# Patient Record
Sex: Female | Born: 1985 | Hispanic: No | Marital: Married | Smoking: Never smoker
Health system: Southern US, Community
[De-identification: ages and names within clinical notes are randomized; demographics above are authoritative.]

---

## 2016-05-11 NOTE — L&D Delivery Note (Signed)
Patient is 31 y.o. G2P1011 [redacted]w[redacted]d admitted for SROM ~0700. S/p Augmentation with Pitocin.   Delivery Note At 7:50 AM a viable female was delivered via Vaginal, Spontaneous Delivery (Presentation: direct OA).  APGAR: 7, 8; weight 7 lb 5.1 oz (3320 g).   Placenta status: Intact.  Cord: 3V with the following complications: None.  Cord pH: N/A  Anesthesia:  Epidural Episiotomy: None Lacerations: 2nd degree Suture Repair: vicryl Est. Blood Loss (mL): 250  Mom to postpartum.  Baby to Couplet care / Skin to Skin.  Upon arrival patient was complete and pushing. She pushed with good maternal effort to deliver a viable infant in cephalic. No nuchal cord present. Baby delivered with some difficulty of the anterior shoulder. Anterior shoulder dystocia reduced with suprapubic pressure. The rest of body delivered with ease. Infant noted to have poor tone and respirations so cord was clamped and cut and baby went to warmer for stimulation.  Placenta delivered spontaneously with gentle cord traction. Fundus firm with massage and Pitocin. Perineum inspected and found to have 2nd degree laceration, which was repaired with good hemostasis achieved. Counts of sharps, instruments, and lap pads were all correct.   Caryl Ada, DO OB Fellow 02/02/2017, 8:41 AM

## 2016-08-13 LAB — OB RESULTS CONSOLE GC/CHLAMYDIA
Chlamydia: NEGATIVE
GC PROBE AMP, GENITAL: NEGATIVE

## 2016-08-13 LAB — OB RESULTS CONSOLE HEPATITIS B SURFACE ANTIGEN: Hepatitis B Surface Ag: NEGATIVE

## 2016-08-13 LAB — OB RESULTS CONSOLE RPR: RPR: NONREACTIVE

## 2016-08-13 LAB — OB RESULTS CONSOLE RUBELLA ANTIBODY, IGM: Rubella: IMMUNE

## 2016-08-13 LAB — OB RESULTS CONSOLE HIV ANTIBODY (ROUTINE TESTING): HIV: NONREACTIVE

## 2016-09-28 ENCOUNTER — Other Ambulatory Visit (HOSPITAL_COMMUNITY): Payer: Self-pay | Admitting: Nurse Practitioner

## 2016-09-28 ENCOUNTER — Other Ambulatory Visit: Payer: Self-pay | Admitting: Obstetrics & Gynecology

## 2016-09-28 ENCOUNTER — Other Ambulatory Visit: Payer: Self-pay | Admitting: Family Medicine

## 2016-09-28 DIAGNOSIS — Z3A23 23 weeks gestation of pregnancy: Secondary | ICD-10-CM

## 2016-09-28 DIAGNOSIS — Z3689 Encounter for other specified antenatal screening: Secondary | ICD-10-CM

## 2016-10-08 ENCOUNTER — Ambulatory Visit (HOSPITAL_COMMUNITY)
Admission: RE | Admit: 2016-10-08 | Discharge: 2016-10-08 | Disposition: A | Discharge status: Home / Self Care | Payer: Medicaid Other | Source: Ambulatory Visit | Attending: Obstetrics & Gynecology | Admitting: Obstetrics & Gynecology

## 2016-10-08 ENCOUNTER — Other Ambulatory Visit (HOSPITAL_COMMUNITY): Payer: Self-pay

## 2016-10-08 ENCOUNTER — Encounter (HOSPITAL_COMMUNITY): Payer: Self-pay

## 2016-10-08 DIAGNOSIS — Z3689 Encounter for other specified antenatal screening: Secondary | ICD-10-CM | POA: Diagnosis present

## 2016-10-08 DIAGNOSIS — Z3A23 23 weeks gestation of pregnancy: Secondary | ICD-10-CM | POA: Insufficient documentation

## 2017-01-04 LAB — OB RESULTS CONSOLE GBS: STREP GROUP B AG: NEGATIVE

## 2017-01-31 ENCOUNTER — Inpatient Hospital Stay (HOSPITAL_COMMUNITY)
Admission: AD | Admit: 2017-01-31 | Discharge: 2017-01-31 | Disposition: A | Discharge status: Home / Self Care | Payer: Medicaid Other | Source: Ambulatory Visit | Attending: Obstetrics and Gynecology | Admitting: Obstetrics and Gynecology

## 2017-01-31 DIAGNOSIS — O479 False labor, unspecified: Secondary | ICD-10-CM

## 2017-01-31 LAB — POCT FERN TEST: POCT Fern Test: NEGATIVE

## 2017-01-31 NOTE — Discharge Instructions (Signed)

## 2017-01-31 NOTE — MAU Note (Signed)
Pt is a G1P0 at 39.6 c/o gush of sticky fluid at 0700 this morning.  No othe5 OB or medical concerns with this pregnancy.

## 2017-02-01 ENCOUNTER — Inpatient Hospital Stay (HOSPITAL_COMMUNITY): Payer: Medicaid Other | Admitting: Anesthesiology

## 2017-02-01 ENCOUNTER — Encounter (HOSPITAL_COMMUNITY): Payer: Self-pay | Admitting: Certified Nurse Midwife

## 2017-02-01 ENCOUNTER — Inpatient Hospital Stay (HOSPITAL_COMMUNITY)
Admission: AD | Admit: 2017-02-01 | Discharge: 2017-02-03 | DRG: 775 | Disposition: A | Discharge status: Home / Self Care | Payer: Medicaid Other | Source: Ambulatory Visit | Attending: Family Medicine | Admitting: Family Medicine

## 2017-02-01 DIAGNOSIS — D649 Anemia, unspecified: Secondary | ICD-10-CM | POA: Diagnosis present

## 2017-02-01 DIAGNOSIS — O9902 Anemia complicating childbirth: Secondary | ICD-10-CM | POA: Diagnosis present

## 2017-02-01 DIAGNOSIS — Z3A40 40 weeks gestation of pregnancy: Secondary | ICD-10-CM

## 2017-02-01 DIAGNOSIS — O26893 Other specified pregnancy related conditions, third trimester: Secondary | ICD-10-CM | POA: Diagnosis present

## 2017-02-01 LAB — CBC
HEMATOCRIT: 33.5 % — AB (ref 36.0–46.0)
Hemoglobin: 11.3 g/dL — ABNORMAL LOW (ref 12.0–15.0)
MCH: 26.6 pg (ref 26.0–34.0)
MCHC: 33.7 g/dL (ref 30.0–36.0)
MCV: 78.8 fL (ref 78.0–100.0)
Platelets: 256 10*3/uL (ref 150–400)
RBC: 4.25 MIL/uL (ref 3.87–5.11)
RDW: 14.6 % (ref 11.5–15.5)
WBC: 10.9 10*3/uL — ABNORMAL HIGH (ref 4.0–10.5)

## 2017-02-01 LAB — TYPE AND SCREEN
ABO/RH(D): B POS
Antibody Screen: NEGATIVE

## 2017-02-01 LAB — ABO/RH: ABO/RH(D): B POS

## 2017-02-01 MED ORDER — FENTANYL 2.5 MCG/ML BUPIVACAINE 1/10 % EPIDURAL INFUSION (WH - ANES)
14.0000 mL/h | INTRAMUSCULAR | Status: DC | PRN
  Administered 2017-02-01 – 2017-02-02 (×3): 14 mL/h via EPIDURAL
  Filled 2017-02-01: qty 100

## 2017-02-01 MED ORDER — OXYTOCIN 40 UNITS IN LACTATED RINGERS INFUSION - SIMPLE MED
1.0000 m[IU]/min | INTRAVENOUS | Status: DC
Start: 2017-02-01 — End: 2017-02-02
  Administered 2017-02-01: 2 m[IU]/min via INTRAVENOUS
  Filled 2017-02-01: qty 1000

## 2017-02-01 MED ORDER — OXYCODONE-ACETAMINOPHEN 5-325 MG PO TABS: 1.0000 | ORAL_TABLET | ORAL | Status: DC | PRN

## 2017-02-01 MED ORDER — LACTATED RINGERS IV SOLN: 500.0000 mL | Freq: Once | INTRAVENOUS | Status: DC

## 2017-02-01 MED ORDER — ACETAMINOPHEN 325 MG PO TABS: 650.0000 mg | ORAL_TABLET | ORAL | Status: DC | PRN

## 2017-02-01 MED ORDER — PHENYLEPHRINE 40 MCG/ML (10ML) SYRINGE FOR IV PUSH (FOR BLOOD PRESSURE SUPPORT)
PREFILLED_SYRINGE | INTRAVENOUS | Status: AC
  Filled 2017-02-01: qty 20

## 2017-02-01 MED ORDER — SOD CITRATE-CITRIC ACID 500-334 MG/5ML PO SOLN: 30.0000 mL | ORAL | Status: DC | PRN

## 2017-02-01 MED ORDER — LIDOCAINE HCL (PF) 1 % IJ SOLN
30.0000 mL | INTRAMUSCULAR | Status: DC | PRN
  Filled 2017-02-01: qty 30

## 2017-02-01 MED ORDER — OXYTOCIN 40 UNITS IN LACTATED RINGERS INFUSION - SIMPLE MED: 2.5000 [IU]/h | INTRAVENOUS | Status: DC

## 2017-02-01 MED ORDER — LIDOCAINE HCL (PF) 1 % IJ SOLN
INTRAMUSCULAR | Status: DC | PRN
  Administered 2017-02-01: 5 mL via EPIDURAL
  Administered 2017-02-01: 3 mL via EPIDURAL
  Administered 2017-02-01: 2 mL via EPIDURAL

## 2017-02-01 MED ORDER — TERBUTALINE SULFATE 1 MG/ML IJ SOLN
0.2500 mg | Freq: Once | INTRAMUSCULAR | Status: DC | PRN
  Filled 2017-02-01: qty 1

## 2017-02-01 MED ORDER — FENTANYL 2.5 MCG/ML BUPIVACAINE 1/10 % EPIDURAL INFUSION (WH - ANES)
INTRAMUSCULAR | Status: AC
  Filled 2017-02-01: qty 100

## 2017-02-01 MED ORDER — LACTATED RINGERS IV SOLN
500.0000 mL | Freq: Once | INTRAVENOUS | Status: AC
  Administered 2017-02-01: 500 mL via INTRAVENOUS

## 2017-02-01 MED ORDER — OXYCODONE-ACETAMINOPHEN 5-325 MG PO TABS: 2.0000 | ORAL_TABLET | ORAL | Status: DC | PRN

## 2017-02-01 MED ORDER — EPHEDRINE 5 MG/ML INJ
10.0000 mg | INTRAVENOUS | Status: DC | PRN
  Filled 2017-02-01: qty 2

## 2017-02-01 MED ORDER — LACTATED RINGERS IV SOLN: 500.0000 mL | INTRAVENOUS | Status: DC | PRN

## 2017-02-01 MED ORDER — OXYTOCIN BOLUS FROM INFUSION
500.0000 mL | Freq: Once | INTRAVENOUS | Status: AC
  Administered 2017-02-02: 500 mL via INTRAVENOUS

## 2017-02-01 MED ORDER — PHENYLEPHRINE 40 MCG/ML (10ML) SYRINGE FOR IV PUSH (FOR BLOOD PRESSURE SUPPORT)
80.0000 ug | PREFILLED_SYRINGE | INTRAVENOUS | Status: DC | PRN
  Filled 2017-02-01: qty 5

## 2017-02-01 MED ORDER — DIPHENHYDRAMINE HCL 50 MG/ML IJ SOLN: 12.5000 mg | INTRAMUSCULAR | Status: DC | PRN

## 2017-02-01 MED ORDER — ONDANSETRON HCL 4 MG/2ML IJ SOLN: 4.0000 mg | Freq: Four times a day (QID) | INTRAMUSCULAR | Status: DC | PRN

## 2017-02-01 MED ORDER — FENTANYL CITRATE (PF) 100 MCG/2ML IJ SOLN
100.0000 ug | INTRAMUSCULAR | Status: DC | PRN
  Administered 2017-02-01 (×2): 100 ug via INTRAVENOUS
  Filled 2017-02-01 (×2): qty 2

## 2017-02-01 MED ORDER — LACTATED RINGERS IV SOLN
INTRAVENOUS | Status: DC
  Administered 2017-02-01 (×2): via INTRAVENOUS

## 2017-02-01 NOTE — Progress Notes (Signed)
Labor Progress Note  S: Patient seen & examined for progress of labor. Patient says she is feeling more painful contractions.   O: BP 119/69   Pulse 94   Temp 97.6 F (36.4 C) (Oral)   Resp 20   Ht  (1.6 m)   Wt 81.2 kg (179 lb)   LMP 04/27/2016   BMI 31.71 kg/m   FHT: 130bpm, mod var, +accels, no decels TOCO: q3-46min, patient looks comfortable during contractions  CVE: Dilation: 2 Effacement (%): 100 Station: -2 Presentation: Vertex Exam by:: Dr. Frances Furbish  A&P: 31 y.o. G2P0010 [redacted]w[redacted]d here for SOL with SROM at 0330 this am.  Cervix is more effaced but continues to be 2 cm.  We will start pitocin 2x2 for augmentation. Anticipate SVD  Lezlie Octave, MD [facility] Resident PGY-1 02/01/2017 5:14 PM

## 2017-02-01 NOTE — Anesthesia Procedure Notes (Signed)
Epidural Patient location during procedure: OB Start time: 02/01/2017 5:24 PM End time: 02/01/2017 5:29 PM  Staffing Anesthesiologist: Cecile Hearing Performed: anesthesiologist   Preanesthetic Checklist Completed: patient identified, pre-op evaluation, timeout performed, IV checked, risks and benefits discussed and monitors and equipment checked  Epidural Patient position: sitting Prep: DuraPrep Patient monitoring: blood pressure and continuous pulse ox Approach: midline Location: L3-L4 Injection technique: LOR air  Needle:  Needle type: Tuohy  Needle gauge: 17 G Needle length: 9 cm Needle insertion depth: 4 cm Catheter size: 19 Gauge Catheter at skin depth: 9 cm Test dose: negative and Other (1% Lidocaine)  Additional Notes Patient identified.  Risk benefits discussed including failed block, incomplete pain control, headache, nerve damage, paralysis, blood pressure changes, nausea, vomiting, reactions to medication both toxic or allergic, and postpartum back pain.  Patient expressed understanding and wished to proceed.  All questions were answered.  Sterile technique used throughout procedure and epidural site dressed with sterile barrier dressing. No paresthesia or other complications noted. The patient did not experience any signs of intravascular injection such as tinnitus or metallic taste in mouth nor signs of intrathecal spread such as rapid motor block. Please see nursing notes for vital signs. Reason for block:procedure for pain

## 2017-02-01 NOTE — Progress Notes (Signed)
Labor Progress Note  S: Patient seen & examined for progress of labor. Patient says she is feeling strong contractions but does not think they feel increasingly painful.  May request an epidural later.   O: BP 113/78   Pulse 88   Temp 98.1 F (36.7 C) (Oral)   Resp 16   Ht  (1.6 m)   Wt 81.2 kg (179 lb)   LMP 04/27/2016   BMI 31.71 kg/m   FHT: 135bpm, mod var, +accels, no decels TOCO: q3-45min, patient looks comfortable during contractions  CVE: Dilation: 2 Effacement (%): 80 Station: -3 Presentation: Vertex Exam by:: M.Lee  A&P: 31 y.o. G2P0010 [redacted]w[redacted]d here for SOL with SROM at 0330 this am.  Cervix has become more effaced after second SVE this morning.  Will allow patient to progress without augmentation unless she stops progressing this afternoon. Continue expectant management Anticipate SVD  Lezlie Octave, MD [facility] Resident PGY-1 02/01/2017 11:27 AM

## 2017-02-01 NOTE — Progress Notes (Signed)
Labor Progress Note Khrystyne Arpin is a 31 y.o. G2P0010 at [redacted]w[redacted]d presented for SROM S:  Patient comfortable with epidural. She had no questions or concerns and was requesting something to eat.   O:  BP 119/70   Pulse 79   Temp 98.1 F (36.7 C)   Resp 16   Ht  (1.6 m)   Wt 81.2 kg (179 lb)   LMP 04/27/2016   SpO2 100%   BMI 31.71 kg/m  EFM: 130/mod/+accel/-decel  CVE: Dilation: 3.5 Effacement (%): 100 Cervical Position: Middle Station: -3, -2 Presentation: Vertex Exam by:: Lorretta Harp RNC   A&P: 31 y.o. G2P0010 [redacted]w[redacted]d presented for SROM. Not currently in active labor.  #Labor: Progressing well on pitocin. Continue to increase  #Pain: epidural  #FWB: category 1 #GBS negative    Dyke Maes Jonnie Kubly, Student-PA 8:48 PM

## 2017-02-01 NOTE — Anesthesia Preprocedure Evaluation (Signed)
Anesthesia Evaluation  Patient identified by MRN, date of birth, ID band Patient awake    Reviewed: Allergy & Precautions, NPO status , Patient's Chart, lab work & pertinent test results  Airway Mallampati: II  TM Distance: >3 FB Neck ROM: Full    Dental  (+) Teeth Intact, Dental Advisory Given   Pulmonary neg pulmonary ROS,    Pulmonary exam normal breath sounds clear to auscultation       Cardiovascular negative cardio ROS Normal cardiovascular exam Rhythm:Regular Rate:Normal     Neuro/Psych negative neurological ROS  negative psych ROS   GI/Hepatic negative GI ROS, Neg liver ROS,   Endo/Other  negative endocrine ROSObesity   Renal/GU negative Renal ROS     Musculoskeletal negative musculoskeletal ROS (+)   Abdominal   Peds  Hematology  (+) Blood dyscrasia, anemia , Plt 256k   Anesthesia Other Findings Day of surgery medications reviewed with the patient.  Reproductive/Obstetrics (+) Pregnancy                             Anesthesia Physical Anesthesia Plan  ASA: II  Anesthesia Plan: Epidural   Post-op Pain Management:    Induction:   PONV Risk Score and Plan: Treatment may vary due to age or medical condition  Airway Management Planned:   Additional Equipment:   Intra-op Plan:   Post-operative Plan:   Informed Consent: I have reviewed the patients History and Physical, chart, labs and discussed the procedure including the risks, benefits and alternatives for the proposed anesthesia with the patient or authorized representative who has indicated his/her understanding and acceptance.   Dental advisory given  Plan Discussed with:   Anesthesia Plan Comments: (Patient identified. Risks/Benefits/Options discussed with patient including but not limited to bleeding, infection, nerve damage, paralysis, failed block, incomplete pain control, headache, blood pressure changes,  nausea, vomiting, reactions to medication both or allergic, itching and postpartum back pain. Confirmed with bedside nurse the patient's most recent platelet count. Confirmed with patient that they are not currently taking any anticoagulation, have any bleeding history or any family history of bleeding disorders. Patient expressed understanding and wished to proceed. All questions were answered.   **Language interpreter utilized throughout entire patient encounter.**)        Anesthesia Quick Evaluation

## 2017-02-01 NOTE — H&P (Signed)
LABOR ADMISSION HISTORY AND PHYSICAL  Brianna Baldwin is a 31 y.o. female G2P0010 with IUP at [redacted]w[redacted]d by LMP presenting for SOL with SROM. She reports +FMs, No LOF, no VB, no blurry vision, no headaches, no peripheral edema, and no RUQ pain.  She plans on breast and bottle feeding. She requests no birth control.  Dating: By LMP --->  Estimated Date of Delivery: 02/01/17  Sono:   , CWD, normal anatomy, cephalic presentation, 553g, 43% EFW  Patient initiated prenatal care at 14 weeks at [facility]. HD  Prenatal History/Complications:  Past Medical History: History reviewed. No pertinent past medical history.  Past Surgical History: History reviewed. No pertinent surgical history.  Obstetrical History: OB History    Gravida Para Term Preterm AB Living   2       1     SAB TAB Ectopic Multiple Live Births   1              Social History: Social History   Social History  . Marital status: Married    Spouse name: N/A  . Number of children: N/A  . Years of education: N/A   Social History Main Topics  . Smoking status: Never Smoker  . Smokeless tobacco: Never Used  . Alcohol use No  . Drug use: No  . Sexual activity: Yes   Other Topics Concern  . None   Social History Narrative  . None    Family History: History reviewed. No pertinent family history.  Allergies: No Known Allergies  Prescriptions Prior to Admission  Medication Sig Dispense Refill Last Dose  . Prenatal Vit-Fe Fumarate-FA (PRENATAL MULTIVITAMIN) TABS tablet Take 1 tablet by mouth daily at 12 noon.   Past Week at Unknown time     Review of Systems   All systems reviewed and negative except as stated in HPI  Blood pressure 126/83, pulse 86, resp. rate 18, last menstrual period 04/27/2016. General appearance: alert, cooperative and no distress Lungs: normal work of breathing Extremities: Homans sign is negative, no sign of DVT Presentation: cephalic Fetal monitoringBaseline: 135 bpm,  Variability: Good {> 6 bpm), Accelerations: Reactive and Decelerations: Absent Uterine activityNone Dilation: 2 Effacement (%): 50 Station: -3 Exam by:: Steward Drone   Prenatal labs: ABO, Rh:  B positive Antibody:  Neg Rubella: Immune RPR:   Neg HBsAg:   Neg HIV:   Neg GBS:   Neg GTT: 1 hr- 159, 3 hr GTT: Fasting: 89, 1 hr: 130, 2 hr: 71, 3 hr: 75  Prenatal Transfer Tool  Maternal Diabetes: No Genetic Screening: Normal Maternal Ultrasounds/Referrals: Normal Fetal Ultrasounds or other Referrals:  None Maternal Substance Abuse:  No Significant Maternal Medications:  None Significant Maternal Lab Results: None  Results for orders placed or performed during the hospital encounter of 01/31/17 (from the past 24 hour(s))  Fern Test   Collection Time: 01/31/17 10:05 AM  Result Value Ref Range   POCT Fern Test Negative = intact amniotic membranes     There are no active problems to display for this patient.   Assessment: Brianna Baldwin is a 30 y.o. G2P0010 at [redacted]w[redacted]d here for SOL with SROM.  #Labor: will allow to progress without augmentation and add augmentation later as necessary  #Pain: Epidural upon request #FWB: Cat 1 #ID: GBS neg #MOF: breast and bottle #MOC:none #Circ:  N/A  Lezlie Octave, MD Family Medicine Resident PGY-1  02/01/2017, 7:18 AM   I confirm that I have verified the information documented in the resident's  note and that I have also personally reperformed the physical exam and all medical decision making activities.   Thressa Sheller 10:26 AM 02/01/17

## 2017-02-01 NOTE — Anesthesia Pain Management Evaluation Note (Signed)
  CRNA Pain Management Visit Note  Patient: Brianna Baldwin, 31 y.o., female  "Hello I am a member of the anesthesia team at [facility]. We have an anesthesia team available at all times to provide care throughout the hospital, including epidural management and anesthesia for C-section. I don't know your plan for the delivery whether it a natural birth, water birth, IV sedation, nitrous supplementation, doula or epidural, but we want to meet your pain goals."   1.Was your pain managed to your expectations on prior hospitalizations?   No prior hospitalizations  2.What is your expectation for pain management during this hospitalization?     Epidural  3.How can we help you reach that goal?   Record the patient's initial score and the patient's pain goal.   Pain: 5  Pain Goal: 5 [facility] wants you to be able to say your pain was always managed very well.  Laban Emperor 02/01/2017

## 2017-02-01 NOTE — MAU Note (Signed)
Pt presents to MAU with contractions that started at 3 am, no vaginal bleeding or discharge. +FM

## 2017-02-02 ENCOUNTER — Encounter (HOSPITAL_COMMUNITY): Payer: Self-pay | Admitting: *Deleted

## 2017-02-02 DIAGNOSIS — Z3A40 40 weeks gestation of pregnancy: Secondary | ICD-10-CM

## 2017-02-02 LAB — RPR: RPR Ser Ql: NONREACTIVE

## 2017-02-02 MED ORDER — PRENATAL MULTIVITAMIN CH
1.0000 | ORAL_TABLET | Freq: Every day | ORAL | Status: DC
  Administered 2017-02-02 – 2017-02-03 (×2): 1 via ORAL
  Filled 2017-02-02 (×2): qty 1

## 2017-02-02 MED ORDER — DIPHENHYDRAMINE HCL 25 MG PO CAPS
25.0000 mg | ORAL_CAPSULE | Freq: Four times a day (QID) | ORAL | Status: DC | PRN
Start: 2017-02-02 — End: 2017-02-03

## 2017-02-02 MED ORDER — ONDANSETRON HCL 4 MG PO TABS: 4.0000 mg | ORAL_TABLET | ORAL | Status: DC | PRN

## 2017-02-02 MED ORDER — BENZOCAINE-MENTHOL 20-0.5 % EX AERO
1.0000 "application " | INHALATION_SPRAY | CUTANEOUS | Status: DC | PRN
  Filled 2017-02-02: qty 56

## 2017-02-02 MED ORDER — ZOLPIDEM TARTRATE 5 MG PO TABS: 5.0000 mg | ORAL_TABLET | Freq: Every evening | ORAL | Status: DC | PRN

## 2017-02-02 MED ORDER — DIBUCAINE 1 % RE OINT: 1.0000 "application " | TOPICAL_OINTMENT | RECTAL | Status: DC | PRN

## 2017-02-02 MED ORDER — COCONUT OIL OIL: 1.0000 "application " | TOPICAL_OIL | Status: DC | PRN

## 2017-02-02 MED ORDER — WITCH HAZEL-GLYCERIN EX PADS: 1.0000 "application " | MEDICATED_PAD | CUTANEOUS | Status: DC | PRN

## 2017-02-02 MED ORDER — TETANUS-DIPHTH-ACELL PERTUSSIS 5-2.5-18.5 LF-MCG/0.5 IM SUSP: 0.5000 mL | Freq: Once | INTRAMUSCULAR | Status: DC

## 2017-02-02 MED ORDER — ONDANSETRON HCL 4 MG/2ML IJ SOLN: 4.0000 mg | INTRAMUSCULAR | Status: DC | PRN

## 2017-02-02 MED ORDER — SIMETHICONE 80 MG PO CHEW: 80.0000 mg | CHEWABLE_TABLET | ORAL | Status: DC | PRN

## 2017-02-02 MED ORDER — SENNOSIDES-DOCUSATE SODIUM 8.6-50 MG PO TABS
2.0000 | ORAL_TABLET | ORAL | Status: DC
  Administered 2017-02-02: 2 via ORAL
  Filled 2017-02-02: qty 2

## 2017-02-02 MED ORDER — IBUPROFEN 600 MG PO TABS
600.0000 mg | ORAL_TABLET | Freq: Four times a day (QID) | ORAL | Status: DC
  Administered 2017-02-02 – 2017-02-03 (×5): 600 mg via ORAL
  Filled 2017-02-02 (×5): qty 1

## 2017-02-02 MED ORDER — ACETAMINOPHEN 325 MG PO TABS
650.0000 mg | ORAL_TABLET | ORAL | Status: DC | PRN
  Administered 2017-02-02: 650 mg via ORAL
  Filled 2017-02-02: qty 2

## 2017-02-02 NOTE — Anesthesia Postprocedure Evaluation (Signed)
Anesthesia Post Note  Patient: Brianna Baldwin  Procedure(s) Performed: * No procedures listed *     Patient location during evaluation: Mother Baby Anesthesia Type: Epidural Level of consciousness: awake, awake and alert, oriented and patient cooperative Pain management: pain level controlled Vital Signs Assessment: post-procedure vital signs reviewed and stable Respiratory status: spontaneous breathing, nonlabored ventilation and respiratory function stable Cardiovascular status: stable Postop Assessment: no headache, no backache, epidural receding, patient able to bend at knees and no apparent nausea or vomiting Anesthetic complications: no    Last Vitals:  Vitals:   02/02/17 0945 02/02/17 1040  BP: 120/73 123/73  Pulse: 86 98  Resp: 17 18  Temp: 37.1 C 37.1 C  SpO2:      Last Pain:  Vitals:   02/02/17 1040  TempSrc: Oral  PainSc: 5    Pain Goal: Patients Stated Pain Goal: 2 (02/02/17 1040)               Lamarion Mcevers L

## 2017-02-02 NOTE — Progress Notes (Signed)
Mom denies needing an interpreter at this time. RN will call interpreter for mom and baby assessments. Royston Cowper, RN

## 2017-02-02 NOTE — Progress Notes (Signed)
Labor Progress Note Brianna Baldwin is a 31 y.o. G2P0010 at [redacted]w[redacted]d presented for SROM S:  Patient resting comfortablely with epidural. She had no questions or concerns and was requesting something to drink.  O:  BP (!) 129/96   Pulse (!) 109   Temp 98.4 F (36.9 C) (Oral)   Resp 15   Ht  (1.6 m)   Wt 81.2 kg (179 lb)   LMP 04/27/2016   SpO2 100%   BMI 31.71 kg/m  EFM: 130/mod/+accel/-decel  CVE: Dilation: 8 Effacement (%): 90 Cervical Position: Middle Station: 0 Presentation: Vertex Exam by:: Sharrie Rothman RN and Hermenia Bers RN    A&P: 31 y.o. G2P0010 [redacted]w[redacted]d presented for SROM. Active labor.  #Labor: Progressing well on pitocin. Continue to increase. Will place IUPC to better monitor contractions #Pain: epidural  #FWB: category 1 #GBS negative    Dyke Maes Cimolino, Student-PA 3:35 AM  Caryl Ada, DO OB Fellow

## 2017-02-02 NOTE — Lactation Note (Signed)
This note was copied from a baby's chart. Lactation Consultation Note  Patient Name: Brianna Baldwin ZOXWR'U Date: 02/02/2017 Reason for consult: Initial assessment   P1, Baby 8 hours old.  Mother's nipples are evert and compressible. Reviewed hand expression and gave baby drops on spoon. Baby is tongue thrusting.  Performed suck training. Assisted w/ latching in cross cradle hold.   Baby intermittently sucked while LC compressed breast off and on. Encouraged mother to provided support for breast and baby. Mom encouraged to feed baby 8-12 times/24 hours and with feeding cues.  Mom made aware of O/P services, breastfeeding support groups, community resources, and our phone # for post-discharge questions.  Discussed basics and suggest family call for assistance as needed.   Maternal Data Has patient been taught Hand Expression?: Yes Does the patient have breastfeeding experience prior to this delivery?: No  Feeding Feeding Type: Breast Fed Length of feed: 5 min  LATCH Score Latch: Repeated attempts needed to sustain latch, nipple held in mouth throughout feeding, stimulation needed to elicit sucking reflex.  Audible Swallowing: A few with stimulation  Type of Nipple: Everted at rest and after stimulation  Comfort (Breast/Nipple): Soft / non-tender  Hold (Positioning): Assistance needed to correctly position infant at breast and maintain latch.  LATCH Score: 7  Interventions    Lactation Tools Discussed/Used     Consult Status Consult Status: Follow-up Date: 02/03/17 Follow-up type: In-patient    Dahlia Byes [facility] 02/02/2017, 4:26 PM

## 2017-02-02 NOTE — Plan of Care (Signed)
Problem: Safety: Goal: Ability to remain free from injury will improve Outcome: Completed/Met Date Met: 02/02/17 Encouraged patient to call for assistance to the bathroom until staff tells her she can ambulate independently.   Problem: Education: Goal: Knowledge of condition will improve Admission education reviewed with patient and family. Patient declines interpreter and signed interpretation form for her sister to interpret. Patient requests to use her sister to interpret unless her sister is not available, then she will use our interpretation services. Requested sister to tell staff at any time if she does not understand information in order for staff to call interpreter.

## 2017-02-03 MED ORDER — IBUPROFEN 600 MG PO TABS: 600.0000 mg | ORAL_TABLET | Freq: Four times a day (QID) | ORAL | 0 refills | Status: AC

## 2017-02-03 MED ORDER — SENNOSIDES-DOCUSATE SODIUM 8.6-50 MG PO TABS: 2.0000 | ORAL_TABLET | Freq: Every evening | ORAL | 1 refills | Status: AC | PRN

## 2017-02-03 NOTE — Discharge Summary (Signed)
OB Discharge Summary     Patient Name: Brianna Baldwin DOB: Sep 20, 1985 MRN: [id number]  Date of admission: 02/01/2017 Delivering MD: Pincus Large   Date of discharge: 02/03/2017  Admitting diagnosis: 40 WEEKS CTX Intrauterine pregnancy: [redacted]w[redacted]d     Secondary diagnosis:  Active Problems:   Normal labor   SVD (spontaneous vaginal delivery)  Additional problems: None     Discharge diagnosis: Term Pregnancy Delivered                                                                                                Post partum procedures:none  Augmentation: Pitocin  Complications: None  Hospital course:  Onset of Labor With Vaginal Delivery     31 y.o. yo G2P1011 at [redacted]w[redacted]d was admitted in Latent Labor on 02/01/2017. Patient had an uncomplicated labor course as follows:  Membrane Rupture Time/Date: 3:30 AM ,02/01/2017   Intrapartum Procedures: Episiotomy: None [1]                                         Lacerations:  2nd degree [3]  Patient had a delivery of a Viable infant. 02/02/2017  Information for the patient's newborn:  Brianna Baldwin [[id number]]  Delivery Method: Vag-Spont    [name] had an uncomplicated postpartum course.  She is ambulating, tolerating a regular diet, passing flatus, and urinating well. Patient is discharged home in stable condition on 02/05/17.   Physical exam  Vitals:   02/02/17 1515 02/02/17 1838 02/02/17 2300 02/03/17 0615  BP: 114/67 121/78 116/72 123/78  Pulse: 82 89 89 93  Resp: Temp: 98.7 F (37.1 C) 99.4 F (37.4 C) 98.2 F (36.8 C) 98.3 F (36.8 C)  TempSrc: Oral Oral Oral Oral  SpO2:   100% 100%  Weight:      Height:       General: alert, cooperative and no distress Lochia: appropriate Uterine Fundus: firm Incision: N/A DVT Evaluation: No evidence of DVT seen on physical exam. Labs: Lab Results  Component Value Date   WBC 10.9 (H) 02/01/2017   HGB 11.3 (L) 02/01/2017   HCT 33.5 (L) 02/01/2017   MCV 78.8  02/01/2017   PLT 256 02/01/2017   No flowsheet data found.  Discharge instruction: per After Visit Summary and "Baby and Me Booklet".  After visit meds:  Allergies as of 02/03/2017   No Known Allergies     Medication List    TAKE these medications   ibuprofen 600 MG tablet Commonly known as:  ADVIL,MOTRIN Take 1 tablet (600 mg total) by mouth every 6 (six) hours.   prenatal multivitamin Tabs tablet Take 1 tablet by mouth daily at 12 noon.   senna-docusate 8.6-50 MG tablet Commonly known as:  Senokot-S Take 2 tablets by mouth at bedtime as needed for mild constipation.            Discharge Care Instructions        Start     Ordered   02/03/17 0000  senna-docusate (SENOKOT-S) 8.6-50 MG tablet  At bedtime PRN     02/03/17 0802   02/03/17 0000  ibuprofen (ADVIL,MOTRIN) 600 MG tablet  Every 6 hours     02/03/17 0802   02/01/17 0000  OB RESULT CONSOLE Group B Strep    Comments:  This external order was created through the Results Console.   02/01/17 1916   02/01/17 0000  OB RESULTS CONSOLE GC/Chlamydia    Comments:  This external order was created through the Results Console.   02/01/17 1916   02/01/17 0000  OB RESULTS CONSOLE RPR    Comments:  This external order was created through the Results Console.    02/01/17 1916   02/01/17 0000  OB RESULTS CONSOLE HIV antibody    Comments:  This external order was created through the Results Console.    02/01/17 1916   02/01/17 0000  OB RESULTS CONSOLE Rubella Antibody    Comments:  This external order was created through the Results Console.    02/01/17 1916   02/01/17 0000  OB RESULTS CONSOLE Hepatitis B surface antigen    Comments:  This external order was created through the Results Console.    02/01/17 1916      Diet: routine diet  Activity: Advance as tolerated. Pelvic rest for 6 weeks.   Outpatient follow up:6 weeks Follow up Appt:No future appointments. Follow up Visit: Follow-up Information     Department, [facility]. Schedule an appointment as soon as possible for a visit.   Why:  for 4 week postpartum visit Contact information: [address] [place] [address] [phone]          Postpartum contraception: Progesterone only pills  Newborn Data: Live born female  Birth Weight: 7 lb 5.1 oz (3320 g) APGAR: 7, 8  Baby Feeding: Bottle and Breast Disposition:home with mother   02/03/2017 Caryl Ada, DO

## 2017-02-03 NOTE — Discharge Instructions (Signed)

## 2017-02-03 NOTE — Progress Notes (Signed)
Post Partum Day 1 Subjective: Patient doing well and reports minimal abdominal discomfort, did not want anything for pain. Minimal vaginal bleeding. Up ad lib, voiding ok, + flatulence. She is interested in POP for contraception. Patient would like to go home today if possible.   Objective: Blood pressure 123/78, pulse 93, temperature 98.3 F (36.8 C), temperature source Oral, resp. rate 18, height  (1.6 m), weight 81.2 kg (179 lb), last menstrual period 04/27/2016, SpO2 100 %, unknown if currently breastfeeding.  Physical Exam:  General: alert, cooperative and no distress Lochia: appropriate Uterine Fundus: firm Incision: N/A DVT Evaluation: No evidence of DVT seen on physical exam. Negative Homan's sign. No cords or calf tenderness.   Recent Labs  02/01/17 0650  HGB 11.3*  HCT 33.5*    Assessment/Plan: Discharge home and Contraception POPs   LOS: 2 days   Dyke Maes Graycie Halley PA-S 02/03/2017, 7:46 AM

## 2017-02-04 ENCOUNTER — Ambulatory Visit: Payer: Self-pay

## 2017-02-04 NOTE — Lactation Note (Signed)
This note was copied from a baby's chart. Lactation Consultation Note  Patient Name: Brianna Baldwin ZOXWR'U Date: 02/04/2017 Reason for consult: Follow-up assessment   [place] interpreter used via telephone. RN assisting with breastfeeding upon entering. RN recently gave baby approx 18 ml of formula and applied #20NS that was prefilled. LC assisted w/ latching providing mother direction on support and holding baby. After a few attempts baby did have some sucking bursts and fell back asleep. Provided mother with manual pump and suggest if she is regularly using NS she will need to post pump 4-6 times per day for 10 min per side. Mom encouraged to feed baby 8-12 times/24 hours and with feeding cues.  Reviewed engorgement care and monitoring voids/stools.    Maternal Data    Feeding Feeding Type: Breast Fed Nipple Type: Slow - flow Length of feed: 10 min  LATCH Score Latch: Repeated attempts needed to sustain latch, nipple held in mouth throughout feeding, stimulation needed to elicit sucking reflex.  Audible Swallowing: None  Type of Nipple: Everted at rest and after stimulation  Comfort (Breast/Nipple): Soft / non-tender  Hold (Positioning): Assistance needed to correctly position infant at breast and maintain latch.  LATCH Score: 6  Interventions Interventions: Breast feeding basics reviewed;Assisted with latch;Skin to skin;Hand express;Adjust position;Support pillows  Lactation Tools Discussed/Used Tools: Nipple Shields Nipple shield size: 20   Consult Status Consult Status: Follow-up Date: 02/05/17 Follow-up type: In-patient    Dahlia Byes [facility] 02/04/2017, 10:41 AM

## 2017-02-08 ENCOUNTER — Ambulatory Visit: Payer: Self-pay

## 2017-02-08 NOTE — Lactation Note (Signed)
This note was copied from a baby's chart. Lactation Consultation Note  Patient Name: Brianna Baldwin [id number] Date: 02/08/2017   Peds RN will see check to see if Mom is pumping. If she is not, RN will set her up with a DEBP.   Lurline Hare [facility] 02/08/2017, 8:55 PM

## 2017-02-09 ENCOUNTER — Ambulatory Visit: Payer: Self-pay

## 2017-02-09 NOTE — Lactation Note (Signed)
This note was copied from a baby's chart. Lactation Consultation Note  Patient Name: Brianna Baldwin [id number]'W Date: 02/09/2017 Reason for consult: Follow-up assessment   Phone interpretation used for Gujarati.   Baby is 72 days old and admitted for hyperbilirubinemia.  Patient left the hospital breastfeeding and formula feeding but since she was discharged, mother has only been breastfeeding for approx 10 min - sometimes only on one breast. Mother has short shaft nipples (R shorter than L side) and in the hospital she was using NS, which she is no longer using. Mother states she has had more difficulty breastfeeding on the R side but stopped using NS. Mother was not set up last night with DEBP as LC instructed Peds RN.  Mother did pump with manual pump x2 and per FOB she pumped approx 60 ml per session. Demonstrated how to wake baby for feeding by undressing baby and placing her STS. Mother was able to express drops of breastmilk before latching. Mother needed continued guidance and reminders on positioning - from cradle to cross cradle. LC provided pillows under baby for support.  Instructed family to place 2 pillows under baby and one under each of mother's arms when she breastfeeds. Noted baby has short mid posterior lingual frenulum. Baby was able to sustain latch for approx 15 min on each breast.  Baby did come off and on during feedings and mother needed hand guidance for increased depth. Parents need lot of reinforcement of teaching. Provided family with formula with instructions that if after breastfeeding baby appears hungry, family need to give baby formula - volume determined by baby's desire. Encouraged mother to keep pumping at home with her manual pump 4-5 times per day for 10 min per breast and give volume back to baby. Explained this volume can be given instead of formula after breastfeeding. Mom encouraged to feed baby 8-12 times/24 hours and with feeding cues at least  every 3 hours.  Suggest family needs to record voids/stools. Provided instruction on 8:30a lactation appointment at [facility] tomorrow.      Maternal Data    Feeding Feeding Type: Breast Fed Length of feed: 30 min (off and on in the beginning)  LATCH Score Latch: Repeated attempts needed to sustain latch, nipple held in mouth throughout feeding, stimulation needed to elicit sucking reflex.  Audible Swallowing: A few with stimulation  Type of Nipple: Everted at rest and after stimulation (short shaft)  Comfort (Breast/Nipple): Soft / non-tender  Hold (Positioning): Assistance needed to correctly position infant at breast and maintain latch.  LATCH Score: 7  Interventions Interventions: Breast feeding basics reviewed;Assisted with latch;Skin to skin;Hand express;Adjust position;Support pillows;Position options;Expressed milk;Hand pump  Lactation Tools Discussed/Used     Consult Status Consult Status: Follow-up Date: 02/10/17 Follow-up type: Out-patient    Dahlia Byes [facility] 02/09/2017, 3:43 PM

## 2018-04-25 IMAGING — US US MFM OB COMP +14 WKS
1 series · 14 of 28 positions shown · non-contrast
Comparison: none

[Series 1: us mfm ob comp +14 wks · 77 acquisitions, 14 frames shown]
[im 3/77]
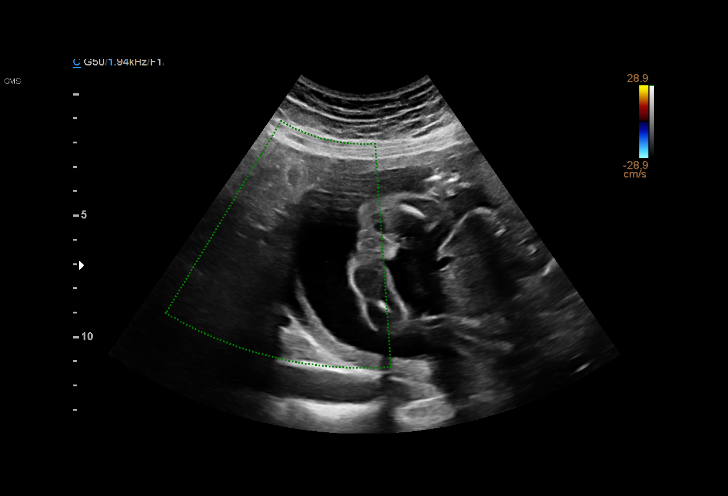
[im 9/77]
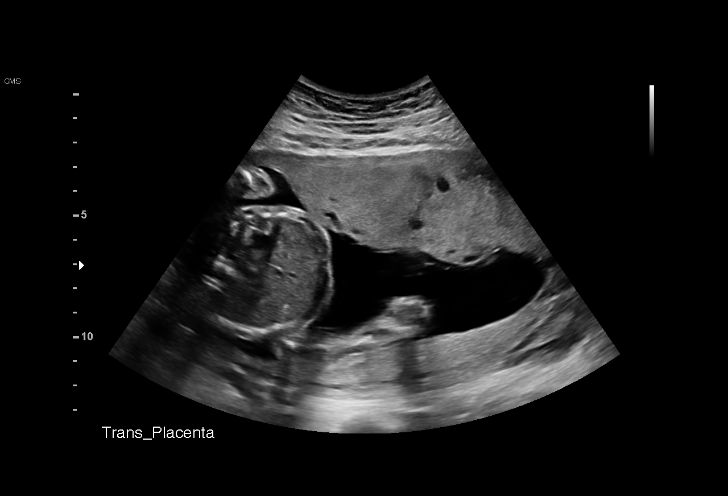
[im 15/77]
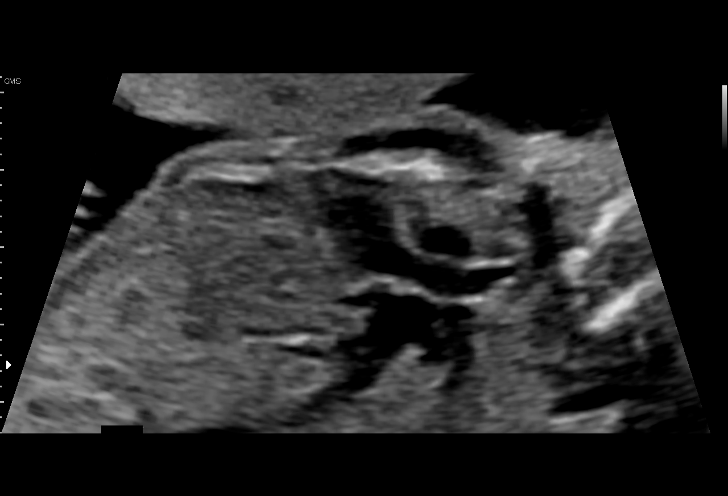
[im 20/77]
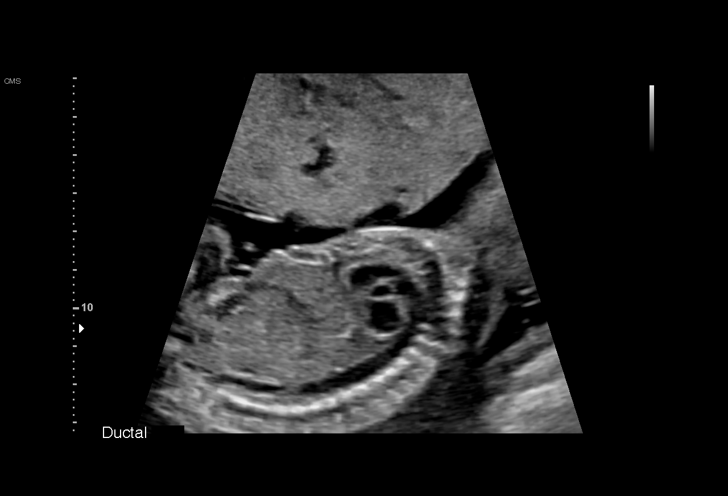
[im 26/77]
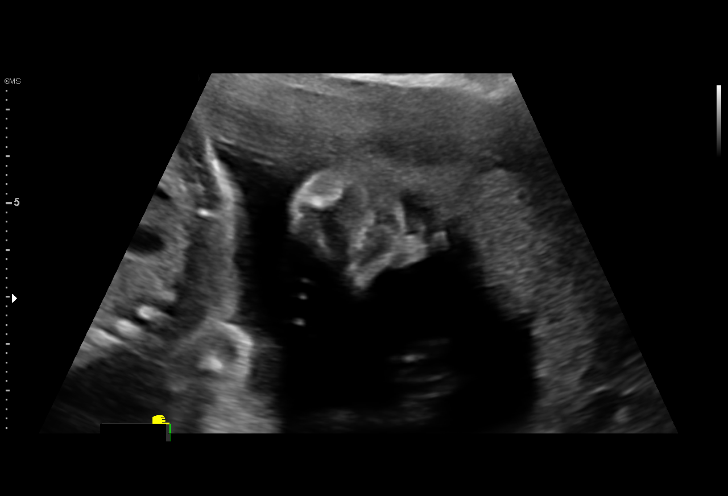
[im 31/77]
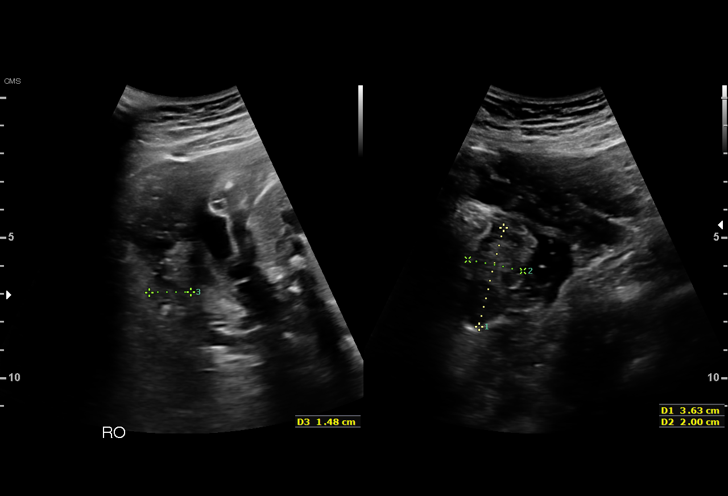
[im 37/77]
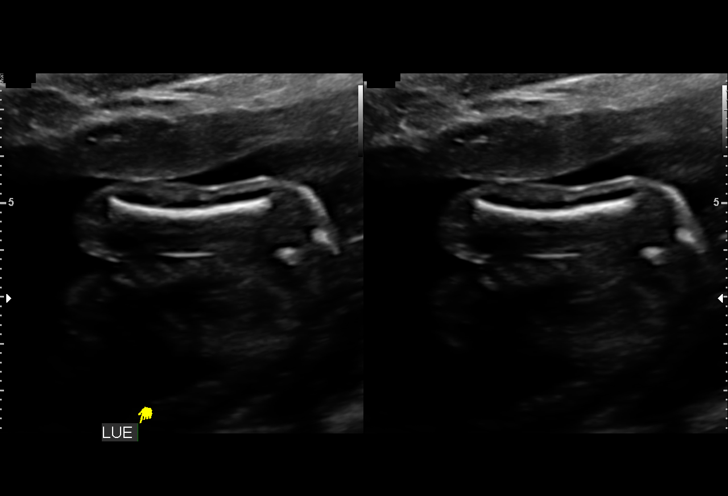
[im 43/77]
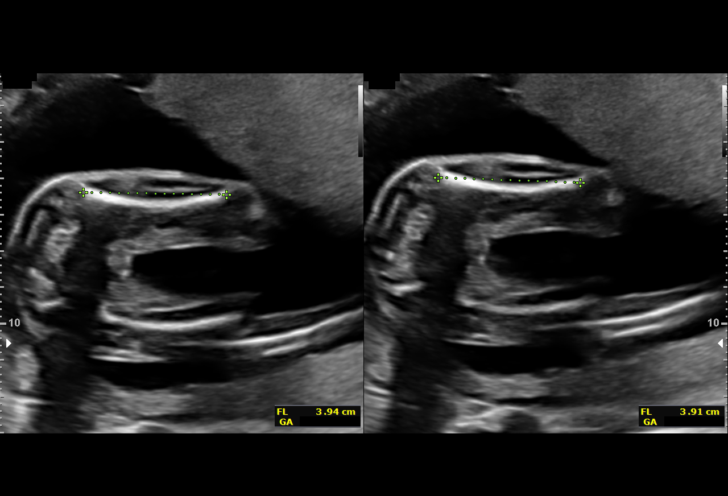
[im 48/77]
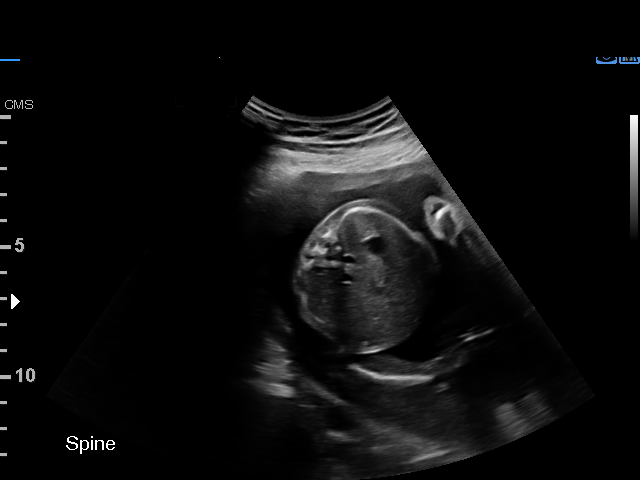
[im 54/77]
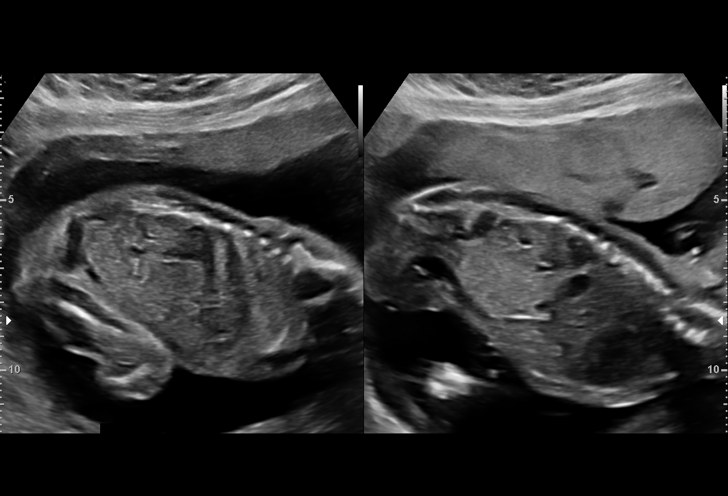
[im 60/77]
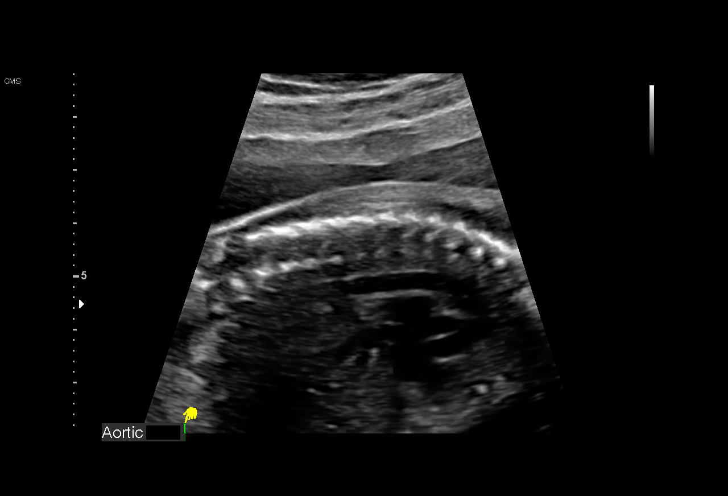
[im 65/77]
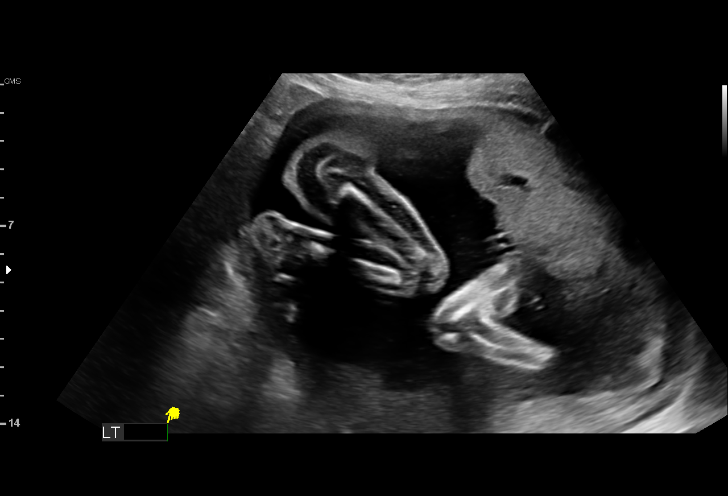
[im 71/77]
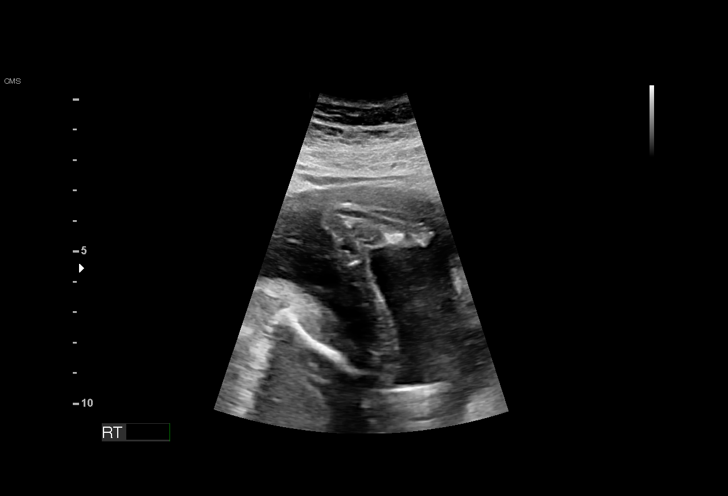
[im 77/77]
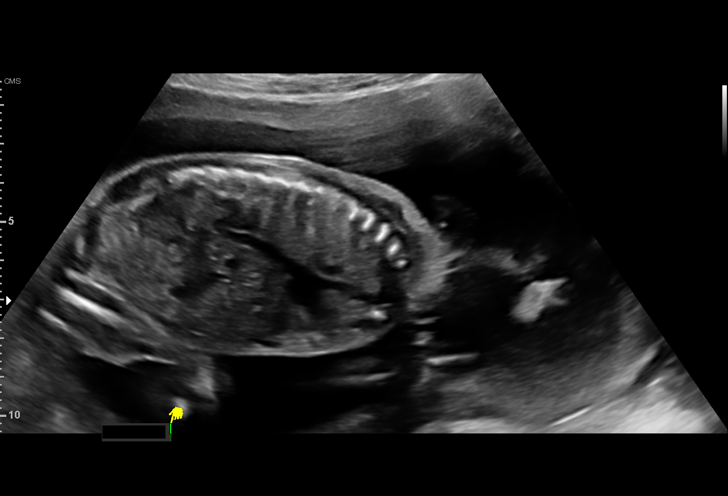

[14 of 28 positions shown; findings below may reference images not displayed]

[REDACTED]-
Faculty Physician

1  NATALINA DUMARIA TUBAGOES           [id number]      [id number]     [id number]
Indications

23 weeks gestation of pregnancy
Encounter for fetal anatomic survey
Fetal Evaluation

Num Of Fetuses:     1
Fetal Heart         145
Rate(bpm):
Cardiac Activity:   Observed
Presentation:       Cephalic
Placenta:           Anterior, above cervical os
P. Cord Insertion:  Visualized

Amniotic Fluid
AFI FV:      Subjectively within normal limits

Largest Pocket(cm)
6.8
Biometry

BPD:      57.1  mm     G. Age:  23w 3d         46  %    CI:        72.56   %    70 - 86
FL/HC:       18.4  %    19.2 -
HC:      213.2  mm     G. Age:  23w 3d         33  %    HC/AC:       1.16       1.05 -
AC:      183.5  mm     G. Age:  23w 1d         34  %    FL/BPD:      68.7  %    71 - 87
FL:       39.2  mm     G. Age:  22w 4d         16  %    FL/AC:       21.4  %    20 - 24
HUM:      34.3  mm     G. Age:  21w 5d          5  %
CER:      24.4  mm     G. Age:  22w 3d         32  %

CM:        7.1  mm

Est. FW:     553   gm     1 lb 4 oz     43  %
Gestational Age

LMP:           23w 3d        Date:  04/27/16                 EDD:   02/01/17
U/S Today:     23w 1d                                        EDD:   02/03/17
Best:          23w 3d     Det. By:  LMP  (04/27/16)          EDD:   02/01/17
Anatomy

Cranium:               Appears normal         Aortic Arch:            Appears normal
Cavum:                 Appears normal         Ductal Arch:            Appears normal
Ventricles:            Appears normal         Diaphragm:              Not well visualized
Choroid Plexus:        Appears normal         Stomach:                Appears normal, left
sided
Cerebellum:            Appears normal         Abdomen:                Appears normal
Posterior Fossa:       Appears normal         Abdominal Wall:         Appears nml (cord
insert, abd wall)
Nuchal Fold:           Not applicable (>20    Cord Vessels:           Appears normal (3
wks GA)                                        vessel cord)
Face:                  Orbits nl; profile not Kidneys:                Appear normal
well visualized
Lips:                  Appears normal         Bladder:                Appears normal
Thoracic:              Appears normal         Spine:                  Appears normal
Heart:                 Appears normal         Upper Extremities:      Appears normal
(4CH, axis, and
situs)
RVOT:                  Appears normal         Lower Extremities:      Appears normal
LVOT:                  Appears normal

Other:  Fetus appears to be a female. Heels and left 5th digit visualized.
Open hands visualized. Technically difficult due to fetal position.
Cervix Uterus Adnexa

Cervix
Length:           3.42  cm.
Normal appearance by transabdominal scan.

Uterus
No abnormality visualized.

Left Ovary
Within normal limits.

Right Ovary
Within normal limits.

Cul De Sac:   No free fluid seen.

Adnexa:       No abnormality visualized.
Impression

SIUP at 23+3 weeks
Normal detailed fetal anatomy; limited views of profile and
diaphragm
Normal amniotic fluid volume
Measurements consistent with LMP dating; EFW at the 43rd
%tile
Recommendations

Follow-up as clinically indicated or follow-up ultrasound to
complete anatomy survey
